# Patient Record
Sex: Female | Born: 2000 | Race: Black or African American | Hispanic: No | Marital: Single | Smoking: Never smoker
Health system: Southern US, Community
[De-identification: ages and names within clinical notes are randomized; demographics above are authoritative.]

## PROBLEM LIST (undated history)

## (undated) DIAGNOSIS — S42309A Unspecified fracture of shaft of humerus, unspecified arm, initial encounter for closed fracture: Secondary | ICD-10-CM

## (undated) DIAGNOSIS — F909 Attention-deficit hyperactivity disorder, unspecified type: Secondary | ICD-10-CM

## (undated) HISTORY — DX: Attention-deficit hyperactivity disorder, unspecified type: F90.9

## (undated) HISTORY — DX: Unspecified fracture of shaft of humerus, unspecified arm, initial encounter for closed fracture: S42.309A

---
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
## 2022-01-07 DIAGNOSIS — J018 Other acute sinusitis: Secondary | ICD-10-CM | POA: Diagnosis not present

## 2022-01-07 DIAGNOSIS — R059 Cough, unspecified: Secondary | ICD-10-CM | POA: Diagnosis not present

## 2022-02-11 DIAGNOSIS — Z3042 Encounter for surveillance of injectable contraceptive: Secondary | ICD-10-CM | POA: Diagnosis not present

## 2022-05-06 DIAGNOSIS — Z3042 Encounter for surveillance of injectable contraceptive: Secondary | ICD-10-CM | POA: Diagnosis not present

## 2022-06-15 ENCOUNTER — Encounter (HOSPITAL_COMMUNITY): Payer: Self-pay

## 2022-06-15 ENCOUNTER — Ambulatory Visit (HOSPITAL_COMMUNITY)
Admission: EM | Admit: 2022-06-15 | Discharge: 2022-06-15 | Disposition: A | Discharge status: Home / Self Care | Payer: Medicaid Other

## 2022-06-15 ENCOUNTER — Ambulatory Visit (INDEPENDENT_AMBULATORY_CARE_PROVIDER_SITE_OTHER): Payer: Medicaid Other

## 2022-06-15 DIAGNOSIS — S6991XA Unspecified injury of right wrist, hand and finger(s), initial encounter: Secondary | ICD-10-CM | POA: Diagnosis not present

## 2022-06-15 DIAGNOSIS — M25531 Pain in right wrist: Secondary | ICD-10-CM

## 2022-06-15 NOTE — ED Triage Notes (Signed)
Right wrist closed in the window last night and heard a cracking sound. House window. No history of problems. Able to wiggle fingers but unable to move the wrist or lift anything.

## 2022-06-15 NOTE — ED Provider Notes (Signed)
[place]    CSN: [id number] Arrival date & time: 06/15/22  1009      History   Chief Complaint Chief Complaint  Patient presents with   Wrist Injury    HPI Kayla Conley is a 22 y.o. female.   HPI 22 year old female presents with right wrist injury reports closing her house window on right wrist last night and heard crackling sound.  Is accompanied by her mother this afternoon.  PMH significant for ADHD and learning disability.  Past Medical History:  Diagnosis Date   ADHD (attention deficit hyperactivity disorder)    Fracture closed, humerus    right arm    Patient Active Problem List   Diagnosis Date Noted   Patient counseled 04/12/2019   Medication management 09/17/2018   ADHD (attention deficit hyperactivity disorder), combined type 06/26/2015   Learning disability 06/26/2015    History reviewed. No pertinent surgical history.  OB History   No obstetric history on file.      Home Medications    Prior to Admission medications   Medication Sig Start Date End Date Taking? Authorizing Provider  medroxyPROGESTERone Acetate 150 MG/ML SUSY Inject into the muscle. 05/06/22  Yes [provider]    Family History Family History  Adopted: Yes    Social History Social History   Tobacco Use   Smoking status: Never   Smokeless tobacco: Never  Vaping Use   Vaping Use: Never used  Substance Use Topics   Alcohol use: Never   Drug use: Yes    Types: Marijuana     Allergies   Cephalosporins   Review of Systems Review of Systems  Musculoskeletal:        Right wrist injury that occurred last night  All other systems reviewed and are negative.    Physical Exam Triage Vital Signs ED Triage Vitals  Enc Vitals Group     BP 06/15/22 1055 125/84     Pulse Rate 06/15/22 1055 97     Resp 06/15/22 1055 18     Temp 06/15/22 1055 98.4 F (36.9 C)     Temp Source 06/15/22 1055 Oral     SpO2 06/15/22 1055 96 %     Weight 06/15/22  1055 100 lb (45.4 kg)     Height 06/15/22 1055 5\' 1"  (1.549 m)     Head Circumference --      Peak Flow --      Pain Score 06/15/22 1053 7     Pain Loc --      Pain Edu? --      Excl. in [place]? --    No data found.  Updated Vital Signs BP 125/84 (BP Location: Left Arm)   Pulse 97   Temp 98.4 F (36.9 C) (Oral)   Resp 18   Ht 5\' 1"  (1.549 m)   Wt 100 lb (45.4 kg)   LMP 05/26/2022 (Exact Date)   SpO2 96%   BMI 18.89 kg/m      Physical Exam Vitals and nursing note reviewed.  Constitutional:      Appearance: Normal appearance. She is normal weight.  HENT:     Head: Normocephalic and atraumatic.     Mouth/Throat:     Mouth: Mucous membranes are moist.     Pharynx: Oropharynx is clear.  Eyes:     Extraocular Movements: Extraocular movements intact.     Conjunctiva/sclera: Conjunctivae normal.     Pupils: Pupils are equal, round, and reactive to light.  Cardiovascular:  Rate and Rhythm: Normal rate and regular rhythm.     Pulses: Normal pulses.     Heart sounds: Normal heart sounds.  Pulmonary:     Effort: Pulmonary effort is normal.     Breath sounds: Normal breath sounds. No wheezing, rhonchi or rales.  Musculoskeletal:        General: Normal range of motion.     Cervical back: Normal range of motion and neck supple.     Comments: Right wrist (volar aspect): Erythematous with mild soft tissue swelling noted limited range of motion with flexion extension/ulnar deviation, grip is 5/5, neurovascular/neurosensory intact, brisk cap refill  Skin:    General: Skin is warm and dry.  Neurological:     General: No focal deficit present.     Mental Status: She is alert and oriented to person, place, and time. Mental status is at baseline.      UC Treatments / Results  Labs (all labs ordered are listed, but only abnormal results are displayed) Labs Reviewed - No data to display  EKG   Radiology DG Wrist Complete Right  Result Date: 06/15/2022 CLINICAL DATA:  Wrist  injury last night. EXAM: RIGHT WRIST - COMPLETE 3+ VIEW COMPARISON:  None Available. FINDINGS: The mineralization and alignment are normal. There is no evidence of acute fracture or dislocation. The joint spaces are preserved. No foreign body or focal soft tissue abnormality identified. IMPRESSION: No evidence of acute fracture or dislocation. Electronically Signed   By: Richardean Sale M.D.   On: 06/15/2022 11:22    Procedures Procedures (including critical care time)  Medications Ordered in UC Medications - No data to display  Initial Impression / Assessment and Plan / UC Course  I have reviewed the triage vital signs and the nursing notes.  Pertinent labs & imaging results that were available during my care of the patient were reviewed by me and considered in my medical decision making (see chart for details).     MDM: 1.  Right wrist injury, initial encounter advised Mother/patient of right wrist x-ray results.  Advised may RICE right wrist for 30 minutes 3 times daily for the next 3 days.  Advised may take OTC Ibuprofen 400 to 600 mg daily, as needed for right wrist pain.  Encouraged to increase daily water intake 64 ounces per day while taking these medications.  Advised if symptoms worsen and/or unresolved please follow-up with [facility] orthopedic provider for further evaluation. Final Clinical Impressions(s) / UC Diagnoses   Final diagnoses:  Right wrist injury, initial encounter     Discharge Instructions      Advised Mother/patient of right wrist x-ray results.  Advised may RICE right wrist for 30 minutes 3 times daily for the next 3 days.  Advised may take OTC Ibuprofen 400 to 600 mg daily, as needed for right wrist pain.  Encouraged to increase daily water intake 64 ounces per day while taking these medications.  Advised if symptoms worsen and/or unresolved please follow-up with [facility] orthopedic provider for further evaluation.     ED Prescriptions   None     PDMP not reviewed this encounter.   Eliezer Lofts, [place] 06/15/22 1209

## 2022-06-15 NOTE — Discharge Instructions (Addendum)
Advised Mother/patient of right wrist x-ray results.  Advised may RICE right wrist for 30 minutes 3 times daily for the next 3 days.  Advised may take OTC Ibuprofen 400 to 600 mg daily, as needed for right wrist pain.  Encouraged to increase daily water intake 64 ounces per day while taking these medications.  Advised if symptoms worsen and/or unresolved please follow-up with [facility] orthopedic provider for further evaluation.

## 2022-07-29 DIAGNOSIS — Z3042 Encounter for surveillance of injectable contraceptive: Secondary | ICD-10-CM | POA: Diagnosis not present

## 2022-10-10 DIAGNOSIS — H6121 Impacted cerumen, right ear: Secondary | ICD-10-CM | POA: Diagnosis not present

## 2022-10-10 DIAGNOSIS — R030 Elevated blood-pressure reading, without diagnosis of hypertension: Secondary | ICD-10-CM | POA: Diagnosis not present

## 2022-10-10 DIAGNOSIS — Z6821 Body mass index (BMI) 21.0-21.9, adult: Secondary | ICD-10-CM | POA: Diagnosis not present

## 2022-10-29 DIAGNOSIS — Z0000 Encounter for general adult medical examination without abnormal findings: Secondary | ICD-10-CM | POA: Diagnosis not present

## 2022-10-29 DIAGNOSIS — Z01411 Encounter for gynecological examination (general) (routine) with abnormal findings: Secondary | ICD-10-CM | POA: Diagnosis not present

## 2022-10-29 DIAGNOSIS — Z113 Encounter for screening for infections with a predominantly sexual mode of transmission: Secondary | ICD-10-CM | POA: Diagnosis not present

## 2022-10-29 DIAGNOSIS — Z3042 Encounter for surveillance of injectable contraceptive: Secondary | ICD-10-CM | POA: Diagnosis not present

## 2022-10-29 DIAGNOSIS — Z01419 Encounter for gynecological examination (general) (routine) without abnormal findings: Secondary | ICD-10-CM | POA: Diagnosis not present

## 2022-11-28 DIAGNOSIS — R059 Cough, unspecified: Secondary | ICD-10-CM | POA: Diagnosis not present

## 2023-01-20 DIAGNOSIS — Z3042 Encounter for surveillance of injectable contraceptive: Secondary | ICD-10-CM | POA: Diagnosis not present

## 2023-04-04 DIAGNOSIS — Z3042 Encounter for surveillance of injectable contraceptive: Secondary | ICD-10-CM | POA: Diagnosis not present

## 2023-06-01 DIAGNOSIS — J069 Acute upper respiratory infection, unspecified: Secondary | ICD-10-CM | POA: Diagnosis not present

## 2023-06-01 DIAGNOSIS — Z1152 Encounter for screening for COVID-19: Secondary | ICD-10-CM | POA: Diagnosis not present

## 2023-06-01 DIAGNOSIS — Z3200 Encounter for pregnancy test, result unknown: Secondary | ICD-10-CM | POA: Diagnosis not present

## 2023-06-01 DIAGNOSIS — Z681 Body mass index (BMI) 19 or less, adult: Secondary | ICD-10-CM | POA: Diagnosis not present

## 2023-06-20 DIAGNOSIS — Z3042 Encounter for surveillance of injectable contraceptive: Secondary | ICD-10-CM | POA: Diagnosis not present

## 2023-09-05 DIAGNOSIS — Z3042 Encounter for surveillance of injectable contraceptive: Secondary | ICD-10-CM | POA: Diagnosis not present

## 2023-11-05 DIAGNOSIS — L0231 Cutaneous abscess of buttock: Secondary | ICD-10-CM | POA: Diagnosis not present

## 2023-11-05 DIAGNOSIS — Z6820 Body mass index (BMI) 20.0-20.9, adult: Secondary | ICD-10-CM | POA: Diagnosis not present

## 2023-11-11 DIAGNOSIS — L0501 Pilonidal cyst with abscess: Secondary | ICD-10-CM | POA: Diagnosis not present

## 2023-11-11 DIAGNOSIS — Z79899 Other long term (current) drug therapy: Secondary | ICD-10-CM | POA: Diagnosis not present

## 2023-11-11 DIAGNOSIS — Z0001 Encounter for general adult medical examination with abnormal findings: Secondary | ICD-10-CM | POA: Diagnosis not present

## 2023-11-11 DIAGNOSIS — Z124 Encounter for screening for malignant neoplasm of cervix: Secondary | ICD-10-CM | POA: Diagnosis not present

## 2023-11-11 DIAGNOSIS — Z3042 Encounter for surveillance of injectable contraceptive: Secondary | ICD-10-CM | POA: Diagnosis not present

## 2023-11-18 DIAGNOSIS — M8588 Other specified disorders of bone density and structure, other site: Secondary | ICD-10-CM | POA: Diagnosis not present

## 2024-01-28 DIAGNOSIS — Z3042 Encounter for surveillance of injectable contraceptive: Secondary | ICD-10-CM | POA: Diagnosis not present
